# Patient Record
Sex: Male | Born: 1960
Health system: Southern US, Community
[De-identification: ages and names within clinical notes are randomized; demographics above are authoritative.]

## PROBLEM LIST (undated history)

## (undated) DIAGNOSIS — G3184 Mild cognitive impairment, so stated: Secondary | ICD-10-CM

## (undated) DIAGNOSIS — E785 Hyperlipidemia, unspecified: Secondary | ICD-10-CM

## (undated) DIAGNOSIS — M5416 Radiculopathy, lumbar region: Secondary | ICD-10-CM

## (undated) DIAGNOSIS — S069X9A Unspecified intracranial injury with loss of consciousness of unspecified duration, initial encounter: Secondary | ICD-10-CM

## (undated) DIAGNOSIS — K649 Unspecified hemorrhoids: Secondary | ICD-10-CM

## (undated) DIAGNOSIS — K2100 Gastro-esophageal reflux disease with esophagitis, without bleeding: Secondary | ICD-10-CM

## (undated) DIAGNOSIS — M1990 Unspecified osteoarthritis, unspecified site: Secondary | ICD-10-CM

## (undated) DIAGNOSIS — S069XAA Unspecified intracranial injury with loss of consciousness status unknown, initial encounter: Secondary | ICD-10-CM

## (undated) DIAGNOSIS — I10 Essential (primary) hypertension: Secondary | ICD-10-CM

## (undated) DIAGNOSIS — R7303 Prediabetes: Secondary | ICD-10-CM

## (undated) DIAGNOSIS — I2510 Atherosclerotic heart disease of native coronary artery without angina pectoris: Secondary | ICD-10-CM

## (undated) HISTORY — DX: Hyperlipidemia, unspecified: E78.5

## (undated) HISTORY — DX: Morbid (severe) obesity due to excess calories: E66.01

## (undated) HISTORY — DX: Mild cognitive impairment of uncertain or unknown etiology: G31.84

## (undated) HISTORY — DX: Unspecified intracranial injury with loss of consciousness status unknown, initial encounter: S06.9XAA

## (undated) HISTORY — DX: Unspecified intracranial injury with loss of consciousness of unspecified duration, initial encounter: S06.9X9A

## (undated) HISTORY — DX: Radiculopathy, lumbar region: M54.16

## (undated) HISTORY — PX: EYE SURGERY: SHX253

## (undated) HISTORY — DX: Unspecified osteoarthritis, unspecified site: M19.90

## (undated) HISTORY — PX: HERNIA REPAIR: SHX51

## (undated) HISTORY — DX: Prediabetes: R73.03

## (undated) HISTORY — PX: HEMORRHOID SURGERY: SHX153

## (undated) HISTORY — DX: Unspecified hemorrhoids: K64.9

---

## 1994-05-13 DIAGNOSIS — I2510 Atherosclerotic heart disease of native coronary artery without angina pectoris: Secondary | ICD-10-CM

## 1994-05-13 HISTORY — DX: Atherosclerotic heart disease of native coronary artery without angina pectoris: I25.10

## 2003-08-27 ENCOUNTER — Emergency Department (HOSPITAL_COMMUNITY): Admission: EM | Admit: 2003-08-27 | Discharge: 2003-08-28 | Payer: Self-pay | Admitting: Emergency Medicine

## 2004-10-29 ENCOUNTER — Ambulatory Visit: Payer: Self-pay

## 2004-11-01 ENCOUNTER — Ambulatory Visit: Payer: Self-pay | Admitting: Internal Medicine

## 2004-11-19 ENCOUNTER — Encounter: Admission: RE | Admit: 2004-11-19 | Discharge: 2004-11-19 | Payer: Self-pay | Admitting: Orthopedic Surgery

## 2004-12-05 ENCOUNTER — Ambulatory Visit: Payer: Self-pay | Admitting: Internal Medicine

## 2004-12-14 ENCOUNTER — Ambulatory Visit: Payer: Self-pay | Admitting: Internal Medicine

## 2005-01-04 ENCOUNTER — Ambulatory Visit: Payer: Self-pay | Admitting: Internal Medicine

## 2015-06-08 ENCOUNTER — Ambulatory Visit (INDEPENDENT_AMBULATORY_CARE_PROVIDER_SITE_OTHER): Payer: Self-pay | Admitting: Physician Assistant

## 2015-06-08 VITALS — BP 124/80 | HR 73 | Temp 97.7°F | Resp 20 | Ht 72.0 in | Wt 305.8 lb

## 2015-06-08 DIAGNOSIS — Z021 Encounter for pre-employment examination: Secondary | ICD-10-CM

## 2015-06-08 DIAGNOSIS — Z0289 Encounter for other administrative examinations: Secondary | ICD-10-CM

## 2015-06-08 NOTE — Progress Notes (Signed)
Steven Holden is a 55 y.o. male who presents today for a commercial driver fitness determination physical exam. The patient reports no problems. The following portions of the patient's history were reviewed and updated as appropriate: allergies, current medications, past family history, past medical history, past social history, past surgical history and problem list.  States he had a sleep test 3 years ago and negative. States his doctor ordered it because of his size. He denies a hx of DM, HTN, HLD, kidney or liver disease, lung disease. He does not use tobacco or drink alcohol. He does not currently have a PCP. He lives in [place]. Works part time  Review of Systems Pertinent items are noted in HPI.   Objective:    Vision/hearing: Hearing Screening Comments: The patient was able to hear a forced whisper from 10 feet. Vision Screening Comments: The patient can distinguish the colors red, amber and green.Peripheral Vision: Right eye 85 degrees. Left eye 85 degrees.  Applicant can recognize and distinguish among traffic control signals and devices showing standard red, green, and amber colors.  Corrective lenses required: Yes  Monocular Vision?: No  Hearing aid requirement: No  Physical Exam  Constitutional: He is oriented to person, place, and time. He appears well-developed and well-nourished.  HENT:  Head: Normocephalic and atraumatic.  Right Ear: Hearing, tympanic membrane, external ear and ear canal normal.  Left Ear: Hearing, tympanic membrane, external ear and ear canal normal.  Nose: Nose normal.  Mouth/Throat: Uvula is midline, oropharynx is clear and moist and mucous membranes are normal.  Eyes: Conjunctivae, EOM and lids are normal. Right eye exhibits no discharge. Left eye exhibits no discharge. No scleral icterus.  Neck: Trachea normal and normal range of motion. Neck supple. Carotid bruit is not present. No thyromegaly  present.  Cardiovascular: Normal rate, regular rhythm, normal heart sounds, intact distal pulses and normal pulses.   No murmur heard. Pulmonary/Chest: Effort normal and breath sounds normal. No respiratory distress. He has no wheezes. He has no rhonchi. He has no rales.  Abdominal: Soft. Normal appearance. There is no tenderness.  Musculoskeletal: Normal range of motion. He exhibits no edema or tenderness.  Lymphadenopathy:       Head (right side): No submental, no submandibular, no tonsillar, no preauricular, no posterior auricular and no occipital adenopathy present.       Head (left side): No submental, no submandibular, no tonsillar, no preauricular, no posterior auricular and no occipital adenopathy present.    He has no cervical adenopathy.  Neurological: He is alert and oriented to person, place, and time. He has normal strength and normal reflexes. No cranial nerve deficit or sensory deficit. Coordination and gait normal.  Skin: Skin is warm, dry and intact. No lesion and no rash noted.  Psychiatric: He has a normal mood and affect. His speech is normal and behavior is normal. Judgment and thought content normal.   BP 124/80 mmHg  Pulse 73  Temp(Src) 97.7 F (36.5 C) (Oral)  Resp 20  Ht 6' (1.829 m)  Wt 305 lb 12.8 oz (138.71 kg)  BMI 41.46 kg/m2  SpO2 98%  Labs: Comments: Sp. Gr. 1.015 Protein: Neg. Blood: Neg. Sugar: Neg.  Assessment:    Healthy male exam.  Meets standards in 12 CFR 391.41;  qualifies for 2 year certificate.    Plan:    Medical examiners certificate completed and printed. Return as needed.  Advised that pt establish care with PCP.

## 2016-04-25 ENCOUNTER — Emergency Department (HOSPITAL_BASED_OUTPATIENT_CLINIC_OR_DEPARTMENT_OTHER)
Admission: EM | Admit: 2016-04-25 | Discharge: 2016-04-25 | Disposition: A | Discharge status: Home / Self Care | Payer: Medicare Other | Attending: Emergency Medicine | Admitting: Emergency Medicine

## 2016-04-25 ENCOUNTER — Emergency Department (HOSPITAL_BASED_OUTPATIENT_CLINIC_OR_DEPARTMENT_OTHER): Payer: Medicare Other

## 2016-04-25 ENCOUNTER — Encounter (HOSPITAL_BASED_OUTPATIENT_CLINIC_OR_DEPARTMENT_OTHER): Payer: Self-pay | Admitting: *Deleted

## 2016-04-25 DIAGNOSIS — R0602 Shortness of breath: Secondary | ICD-10-CM | POA: Diagnosis not present

## 2016-04-25 DIAGNOSIS — I2510 Atherosclerotic heart disease of native coronary artery without angina pectoris: Secondary | ICD-10-CM | POA: Insufficient documentation

## 2016-04-25 DIAGNOSIS — I10 Essential (primary) hypertension: Secondary | ICD-10-CM | POA: Insufficient documentation

## 2016-04-25 DIAGNOSIS — R0789 Other chest pain: Secondary | ICD-10-CM | POA: Insufficient documentation

## 2016-04-25 DIAGNOSIS — R079 Chest pain, unspecified: Secondary | ICD-10-CM | POA: Diagnosis not present

## 2016-04-25 HISTORY — DX: Gastro-esophageal reflux disease with esophagitis: K21.0

## 2016-04-25 HISTORY — DX: Atherosclerotic heart disease of native coronary artery without angina pectoris: I25.10

## 2016-04-25 HISTORY — DX: Gastro-esophageal reflux disease with esophagitis, without bleeding: K21.00

## 2016-04-25 HISTORY — DX: Essential (primary) hypertension: I10

## 2016-04-25 LAB — CBC
HEMATOCRIT: 43.9 % (ref 39.0–52.0)
Hemoglobin: 14.5 g/dL (ref 13.0–17.0)
MCH: 28.3 pg (ref 26.0–34.0)
MCHC: 33 g/dL (ref 30.0–36.0)
MCV: 85.7 fL (ref 78.0–100.0)
PLATELETS: 260 10*3/uL (ref 150–400)
RBC: 5.12 MIL/uL (ref 4.22–5.81)
RDW: 14.8 % (ref 11.5–15.5)
WBC: 11.6 10*3/uL — ABNORMAL HIGH (ref 4.0–10.5)

## 2016-04-25 LAB — COMPREHENSIVE METABOLIC PANEL
ALT: 70 U/L — ABNORMAL HIGH (ref 17–63)
ANION GAP: 9 (ref 5–15)
AST: 41 U/L (ref 15–41)
Albumin: 4.4 g/dL (ref 3.5–5.0)
Alkaline Phosphatase: 84 U/L (ref 38–126)
BILIRUBIN TOTAL: 0.7 mg/dL (ref 0.3–1.2)
BUN: 11 mg/dL (ref 6–20)
CHLORIDE: 101 mmol/L (ref 101–111)
CO2: 25 mmol/L (ref 22–32)
Calcium: 9.3 mg/dL (ref 8.9–10.3)
Creatinine, Ser: 1.17 mg/dL (ref 0.61–1.24)
GFR calc Af Amer: >60 mL/min (ref >60)
Glucose, Bld: 100 mg/dL — ABNORMAL HIGH (ref 65–99)
POTASSIUM: 3.9 mmol/L (ref 3.5–5.1)
Sodium: 135 mmol/L (ref 135–145)
TOTAL PROTEIN: 8.5 g/dL — AB (ref 6.5–8.1)

## 2016-04-25 LAB — TROPONIN I

## 2016-04-25 LAB — BRAIN NATRIURETIC PEPTIDE: B NATRIURETIC PEPTIDE 5: 16.2 pg/mL (ref 0.0–100.0)

## 2016-04-25 MED ORDER — ASPIRIN 81 MG PO CHEW
324.0000 mg | CHEWABLE_TABLET | Freq: Once | ORAL | Status: AC
  Administered 2016-04-25: 324 mg via ORAL
  Filled 2016-04-25: qty 4

## 2016-04-25 MED ORDER — SODIUM CHLORIDE 0.9 % IV SOLN
INTRAVENOUS | Status: DC
  Administered 2016-04-25: 18:00:00 via INTRAVENOUS

## 2016-04-25 NOTE — ED Notes (Signed)
EDNP in to see and update pt/family.

## 2016-04-25 NOTE — ED Notes (Signed)
Snack given, family at [facility], denies sx or complaints, VSS.

## 2016-04-25 NOTE — ED Notes (Signed)
EDNP at [facility] speaking with pt/family.

## 2016-04-25 NOTE — ED Notes (Signed)
Hand off report provided to Jon-RN

## 2016-04-25 NOTE — ED Provider Notes (Signed)
[facility] Provider Note   CSN: [id number] Arrival date & time: 04/25/16  1714  By signing my name below, I, Emmanuella Mensah, attest that this documentation has been prepared under the direction and in the presence of Etta Quill, NP. Electronically Signed: Judithann Sauger, ED Scribe. 04/25/16. 5:50 PM.   History   Chief Complaint Chief Complaint  Patient presents with  . Chest Pain    HPI Comments: Steven Holden is a 55 y.o. male with a hx of hypertension and CAD who presents to the Emergency Department complaining of sudden onset, moderate non-radiating left lateral chest pain he describes as squeezing onset several minutes PTA. Pt was in the ED visiting another pt when he had onset of this pain. No alleviating factors noted. Pt has not tried any medications PTA. He has NKDA. He states that he had these symptoms when he had an MI in 1996 and stopped taking his HTN medications several years ago due to the cost. He denies any personal hx of DM but reports a family hx. He denies any daily ETOH, cigarette, or illicit drug use. He also denies any fever, chills, nausea, vomiting, diaphoresis, shortness of breath, or any other symptoms.   The history is provided by the patient. No language interpreter was used.  Chest Pain   This is a new problem. The current episode started less than 1 hour ago. The problem has not changed since onset.The pain is present in the lateral region. The pain is moderate. Quality: squeezing . The pain does not radiate. Pertinent negatives include no diaphoresis, no fever, no nausea, no shortness of breath and no vomiting. He has tried nothing for the symptoms. Risk factors include male gender.  His past medical history is significant for CAD and hypertension.  Pertinent negatives for past medical history include no diabetes.  His family medical history is significant for diabetes.    No past medical history on file.  There are no active  problems to display for this patient.   No past surgical history on file.     Home Medications    Prior to Admission medications   Not on File    Family History No family history on file.  Social History Social History  Substance Use Topics  . Smoking status: Never Smoker  . Smokeless tobacco: Not on file  . Alcohol use No     Allergies   Patient has no known allergies.   Review of Systems Review of Systems  Constitutional: Negative for chills, diaphoresis and fever.  Respiratory: Negative for shortness of breath.   Cardiovascular: Positive for chest pain.  Gastrointestinal: Negative for nausea and vomiting.  All other systems reviewed and are negative.    Physical Exam Updated Vital Signs There were no vitals taken for this visit.  Physical Exam  Constitutional: He is oriented to person, place, and time. He appears well-developed and well-nourished. No distress.  HENT:  Head: Normocephalic and atraumatic.  Eyes: Conjunctivae and EOM are normal.  Neck: Neck supple. No tracheal deviation present.  Cardiovascular: Normal rate, regular rhythm and normal heart sounds.   Pulmonary/Chest: Effort normal. No respiratory distress.  Musculoskeletal: Normal range of motion.  Neurological: He is alert and oriented to person, place, and time.  Skin: Skin is warm and dry.  Psychiatric: He has a normal mood and affect. His behavior is normal.  Nursing note and vitals reviewed.    ED Treatments / Results  DIAGNOSTIC STUDIES: Oxygen Saturation is 97% on  RA, adequate by my interpretation.    COORDINATION OF CARE: 5:29 PM- Pt advised of plan for treatment and pt agrees. Pt will receive lab work, chest x-ray, and EKG for further evaluation.    Labs (all labs ordered are listed, but only abnormal results are displayed) Labs Reviewed  CBC - Abnormal; Notable for the following:       Result Value   WBC 11.6 (*)    All other components within normal limits    COMPREHENSIVE METABOLIC PANEL - Abnormal; Notable for the following:    Glucose, Bld 100 (*)    Total Protein 8.5 (*)    ALT 70 (*)    All other components within normal limits  BRAIN NATRIURETIC PEPTIDE  TROPONIN I  TROPONIN I    EKG  EKG Interpretation None       Radiology Dg Chest Portable 1 View  Result Date: 04/25/2016 CLINICAL DATA:  Patient with chest pain shortness of breath. EXAM: PORTABLE CHEST 1 VIEW COMPARISON:  Chest radiograph 12/02/2013. FINDINGS: Stable cardiac and mediastinal contours. No consolidative pulmonary opacities. No pleural effusion or pneumothorax. IMPRESSION: No acute cardiopulmonary process. Electronically Signed   By: Lovey Newcomer M.D.   On: 04/25/2016 18:17    Procedures Procedures (including critical care time)  Medications Ordered in ED Medications - No data to display   Initial Impression / Assessment and Plan / ED Course  Etta Quill, NP has reviewed the triage vital signs and the nursing notes.  Pertinent labs & imaging results that were available during my care of the patient were reviewed by me and considered in my medical decision making (see chart for details).  Clinical Course   Patient is to be discharged with recommendation to follow up with PCP/cardiology in regards to today's hospital visit. Chest pain is not likely of cardiac or pulmonary etiology d/t presentation, perc negative, VSS, no tracheal deviation, no JVD or new murmur, RRR, breath sounds equal bilaterally, EKG without acute abnormalities, negative troponin, and negative CXR. Pt has been advised to return to the ED is CP becomes exertional, associated with diaphoresis or nausea, radiates to left jaw/arm, worsens or becomes concerning in any way. Pt appears reliable for follow up and is agreeable to discharge.   Case has been discussed with  Dr. Tyrone Nine who agrees with the above plan to discharge.     Final Clinical Impressions(s) / ED Diagnoses   Final diagnoses:   Other chest pain    New Prescriptions New Prescriptions   No medications on file   I personally performed the services described in this documentation, which was scribed in my presence. The recorded information has been reviewed and is accurate.    Etta Quill, NP 04/25/16 [place], DO 04/25/16 2224

## 2016-04-25 NOTE — ED Triage Notes (Signed)
Patient came to nurse first desk clutching his left chest stating that he felt like someone was squeezing his heart.  History of same.

## 2016-04-25 NOTE — ED Notes (Signed)
PCXR done

## 2016-06-27 DIAGNOSIS — L570 Actinic keratosis: Secondary | ICD-10-CM | POA: Diagnosis not present

## 2016-06-27 DIAGNOSIS — L281 Prurigo nodularis: Secondary | ICD-10-CM | POA: Diagnosis not present

## 2016-06-27 DIAGNOSIS — D485 Neoplasm of uncertain behavior of skin: Secondary | ICD-10-CM | POA: Diagnosis not present

## 2016-10-12 DIAGNOSIS — I252 Old myocardial infarction: Secondary | ICD-10-CM | POA: Diagnosis not present

## 2016-10-12 DIAGNOSIS — S61431A Puncture wound without foreign body of right hand, initial encounter: Secondary | ICD-10-CM | POA: Diagnosis not present

## 2017-02-19 DIAGNOSIS — Z1211 Encounter for screening for malignant neoplasm of colon: Secondary | ICD-10-CM | POA: Diagnosis not present

## 2017-03-05 DIAGNOSIS — H40033 Anatomical narrow angle, bilateral: Secondary | ICD-10-CM | POA: Diagnosis not present

## 2017-03-05 DIAGNOSIS — H40039 Anatomical narrow angle, unspecified eye: Secondary | ICD-10-CM | POA: Diagnosis not present

## 2017-03-05 DIAGNOSIS — H2513 Age-related nuclear cataract, bilateral: Secondary | ICD-10-CM | POA: Diagnosis not present

## 2017-03-17 DIAGNOSIS — Z1211 Encounter for screening for malignant neoplasm of colon: Secondary | ICD-10-CM | POA: Diagnosis not present

## 2017-03-17 DIAGNOSIS — E785 Hyperlipidemia, unspecified: Secondary | ICD-10-CM | POA: Diagnosis not present

## 2017-03-17 DIAGNOSIS — I252 Old myocardial infarction: Secondary | ICD-10-CM | POA: Diagnosis not present

## 2017-03-17 DIAGNOSIS — E669 Obesity, unspecified: Secondary | ICD-10-CM | POA: Diagnosis not present

## 2017-03-17 DIAGNOSIS — I2510 Atherosclerotic heart disease of native coronary artery without angina pectoris: Secondary | ICD-10-CM | POA: Diagnosis not present

## 2017-03-18 DIAGNOSIS — Z1331 Encounter for screening for depression: Secondary | ICD-10-CM | POA: Diagnosis not present

## 2017-03-18 DIAGNOSIS — Z2821 Immunization not carried out because of patient refusal: Secondary | ICD-10-CM | POA: Diagnosis not present

## 2017-03-18 DIAGNOSIS — I10 Essential (primary) hypertension: Secondary | ICD-10-CM | POA: Diagnosis not present

## 2017-03-18 DIAGNOSIS — Z13220 Encounter for screening for lipoid disorders: Secondary | ICD-10-CM | POA: Diagnosis not present

## 2017-03-18 DIAGNOSIS — Z87820 Personal history of traumatic brain injury: Secondary | ICD-10-CM | POA: Diagnosis not present

## 2017-03-18 DIAGNOSIS — Z6841 Body Mass Index (BMI) 40.0 and over, adult: Secondary | ICD-10-CM | POA: Diagnosis not present

## 2017-03-21 DIAGNOSIS — I10 Essential (primary) hypertension: Secondary | ICD-10-CM | POA: Diagnosis not present

## 2017-03-21 DIAGNOSIS — Z13220 Encounter for screening for lipoid disorders: Secondary | ICD-10-CM | POA: Diagnosis not present

## 2017-04-01 DIAGNOSIS — I10 Essential (primary) hypertension: Secondary | ICD-10-CM | POA: Diagnosis not present

## 2017-04-01 DIAGNOSIS — E7800 Pure hypercholesterolemia, unspecified: Secondary | ICD-10-CM | POA: Diagnosis not present

## 2017-04-01 DIAGNOSIS — Z6841 Body Mass Index (BMI) 40.0 and over, adult: Secondary | ICD-10-CM | POA: Diagnosis not present

## 2017-04-01 DIAGNOSIS — R7303 Prediabetes: Secondary | ICD-10-CM | POA: Diagnosis not present

## 2017-04-01 DIAGNOSIS — Z87820 Personal history of traumatic brain injury: Secondary | ICD-10-CM | POA: Diagnosis not present

## 2017-04-10 DIAGNOSIS — Z0000 Encounter for general adult medical examination without abnormal findings: Secondary | ICD-10-CM | POA: Diagnosis not present

## 2017-04-10 DIAGNOSIS — Z1331 Encounter for screening for depression: Secondary | ICD-10-CM | POA: Diagnosis not present

## 2017-04-10 DIAGNOSIS — Z136 Encounter for screening for cardiovascular disorders: Secondary | ICD-10-CM | POA: Diagnosis not present

## 2017-04-10 DIAGNOSIS — E669 Obesity, unspecified: Secondary | ICD-10-CM | POA: Diagnosis not present

## 2017-04-10 DIAGNOSIS — Z6841 Body Mass Index (BMI) 40.0 and over, adult: Secondary | ICD-10-CM | POA: Diagnosis not present

## 2017-04-10 DIAGNOSIS — Z125 Encounter for screening for malignant neoplasm of prostate: Secondary | ICD-10-CM | POA: Diagnosis not present

## 2017-04-10 DIAGNOSIS — E785 Hyperlipidemia, unspecified: Secondary | ICD-10-CM | POA: Diagnosis not present

## 2017-04-10 DIAGNOSIS — Z139 Encounter for screening, unspecified: Secondary | ICD-10-CM | POA: Diagnosis not present

## 2017-04-10 DIAGNOSIS — Z9181 History of falling: Secondary | ICD-10-CM | POA: Diagnosis not present

## 2017-07-15 DIAGNOSIS — M5416 Radiculopathy, lumbar region: Secondary | ICD-10-CM | POA: Diagnosis not present

## 2017-07-15 DIAGNOSIS — I10 Essential (primary) hypertension: Secondary | ICD-10-CM | POA: Diagnosis not present

## 2017-07-15 DIAGNOSIS — E7800 Pure hypercholesterolemia, unspecified: Secondary | ICD-10-CM | POA: Diagnosis not present

## 2017-08-15 DIAGNOSIS — I10 Essential (primary) hypertension: Secondary | ICD-10-CM | POA: Diagnosis not present

## 2017-08-15 DIAGNOSIS — M5416 Radiculopathy, lumbar region: Secondary | ICD-10-CM | POA: Diagnosis not present

## 2017-11-14 DIAGNOSIS — I10 Essential (primary) hypertension: Secondary | ICD-10-CM | POA: Diagnosis not present

## 2017-11-14 DIAGNOSIS — F329 Major depressive disorder, single episode, unspecified: Secondary | ICD-10-CM | POA: Diagnosis not present

## 2017-11-14 DIAGNOSIS — Z87820 Personal history of traumatic brain injury: Secondary | ICD-10-CM | POA: Diagnosis not present

## 2017-11-14 DIAGNOSIS — Z1339 Encounter for screening examination for other mental health and behavioral disorders: Secondary | ICD-10-CM | POA: Diagnosis not present

## 2017-12-18 DIAGNOSIS — F329 Major depressive disorder, single episode, unspecified: Secondary | ICD-10-CM | POA: Diagnosis not present

## 2017-12-18 DIAGNOSIS — Z6831 Body mass index (BMI) 31.0-31.9, adult: Secondary | ICD-10-CM | POA: Diagnosis not present

## 2017-12-18 DIAGNOSIS — I10 Essential (primary) hypertension: Secondary | ICD-10-CM | POA: Diagnosis not present

## 2018-02-13 ENCOUNTER — Encounter: Payer: Self-pay | Admitting: Cardiovascular Disease

## 2018-02-13 ENCOUNTER — Ambulatory Visit (INDEPENDENT_AMBULATORY_CARE_PROVIDER_SITE_OTHER): Payer: Medicare Other | Admitting: Cardiovascular Disease

## 2018-02-13 DIAGNOSIS — R0602 Shortness of breath: Secondary | ICD-10-CM | POA: Diagnosis not present

## 2018-02-13 DIAGNOSIS — R079 Chest pain, unspecified: Secondary | ICD-10-CM

## 2018-02-13 DIAGNOSIS — R9431 Abnormal electrocardiogram [ECG] [EKG]: Secondary | ICD-10-CM | POA: Diagnosis not present

## 2018-02-13 DIAGNOSIS — E7800 Pure hypercholesterolemia, unspecified: Secondary | ICD-10-CM | POA: Diagnosis not present

## 2018-02-13 DIAGNOSIS — M5416 Radiculopathy, lumbar region: Secondary | ICD-10-CM | POA: Diagnosis not present

## 2018-02-13 DIAGNOSIS — G894 Chronic pain syndrome: Secondary | ICD-10-CM

## 2018-02-13 DIAGNOSIS — R0609 Other forms of dyspnea: Secondary | ICD-10-CM | POA: Diagnosis not present

## 2018-02-13 DIAGNOSIS — I10 Essential (primary) hypertension: Secondary | ICD-10-CM | POA: Diagnosis not present

## 2018-02-13 NOTE — Patient Instructions (Addendum)
We will look into the price of the treadmill myoview If expensive,  We could consider the CT coronary calcium score [place], $150  Blood pressure cuff   Medication Instructions:   No medication changes made  Labwork:  Check a thyroid and testosterone  Testing/Procedures:  No further testing at this time   Follow-Up: It was a pleasure seeing you in the office today. Please call us if you have new issues that need to be addressed before your next appt.  [phone]  Your physician wants you to follow-up in:  As needed  If you need a refill on your cardiac medications before your next appointment, please call your pharmacy.  For educational health videos Log in to : [email or web address] Or : [email or web address], password : [email or web address]
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
## 2018-03-04 DIAGNOSIS — R5382 Chronic fatigue, unspecified: Secondary | ICD-10-CM | POA: Diagnosis not present

## 2018-04-07 DIAGNOSIS — E291 Testicular hypofunction: Secondary | ICD-10-CM | POA: Diagnosis not present

## 2018-04-07 DIAGNOSIS — Z125 Encounter for screening for malignant neoplasm of prostate: Secondary | ICD-10-CM | POA: Diagnosis not present

## 2018-04-07 DIAGNOSIS — Z6841 Body Mass Index (BMI) 40.0 and over, adult: Secondary | ICD-10-CM | POA: Diagnosis not present

## 2018-04-08 DIAGNOSIS — Z125 Encounter for screening for malignant neoplasm of prostate: Secondary | ICD-10-CM | POA: Diagnosis not present

## 2018-04-08 DIAGNOSIS — E291 Testicular hypofunction: Secondary | ICD-10-CM | POA: Diagnosis not present

## 2018-04-08 DIAGNOSIS — I10 Essential (primary) hypertension: Secondary | ICD-10-CM | POA: Diagnosis not present

## 2018-04-15 DIAGNOSIS — E291 Testicular hypofunction: Secondary | ICD-10-CM | POA: Diagnosis not present

## 2018-04-20 DIAGNOSIS — Z125 Encounter for screening for malignant neoplasm of prostate: Secondary | ICD-10-CM | POA: Diagnosis not present

## 2018-04-20 DIAGNOSIS — Z136 Encounter for screening for cardiovascular disorders: Secondary | ICD-10-CM | POA: Diagnosis not present

## 2018-04-20 DIAGNOSIS — Z1331 Encounter for screening for depression: Secondary | ICD-10-CM | POA: Diagnosis not present

## 2018-04-20 DIAGNOSIS — E785 Hyperlipidemia, unspecified: Secondary | ICD-10-CM | POA: Diagnosis not present

## 2018-04-20 DIAGNOSIS — Z6841 Body Mass Index (BMI) 40.0 and over, adult: Secondary | ICD-10-CM | POA: Diagnosis not present

## 2018-04-20 DIAGNOSIS — Z0000 Encounter for general adult medical examination without abnormal findings: Secondary | ICD-10-CM | POA: Diagnosis not present

## 2018-04-20 DIAGNOSIS — E669 Obesity, unspecified: Secondary | ICD-10-CM | POA: Diagnosis not present

## 2018-04-29 DIAGNOSIS — E291 Testicular hypofunction: Secondary | ICD-10-CM | POA: Diagnosis not present

## 2018-05-14 DIAGNOSIS — E291 Testicular hypofunction: Secondary | ICD-10-CM | POA: Diagnosis not present

## 2018-05-27 DIAGNOSIS — E875 Hyperkalemia: Secondary | ICD-10-CM | POA: Diagnosis not present

## 2018-05-27 DIAGNOSIS — E291 Testicular hypofunction: Secondary | ICD-10-CM | POA: Diagnosis not present

## 2018-06-29 DIAGNOSIS — J209 Acute bronchitis, unspecified: Secondary | ICD-10-CM | POA: Diagnosis not present

## 2018-06-29 DIAGNOSIS — Z6841 Body Mass Index (BMI) 40.0 and over, adult: Secondary | ICD-10-CM | POA: Diagnosis not present

## 2018-06-29 DIAGNOSIS — J101 Influenza due to other identified influenza virus with other respiratory manifestations: Secondary | ICD-10-CM | POA: Diagnosis not present

## 2019-02-08 DIAGNOSIS — M1990 Unspecified osteoarthritis, unspecified site: Secondary | ICD-10-CM | POA: Diagnosis not present

## 2019-02-08 DIAGNOSIS — I252 Old myocardial infarction: Secondary | ICD-10-CM | POA: Diagnosis not present

## 2019-02-08 DIAGNOSIS — S60562A Insect bite (nonvenomous) of left hand, initial encounter: Secondary | ICD-10-CM | POA: Diagnosis not present

## 2019-02-08 DIAGNOSIS — T63421A Toxic effect of venom of ants, accidental (unintentional), initial encounter: Secondary | ICD-10-CM | POA: Diagnosis not present

## 2019-02-08 DIAGNOSIS — E785 Hyperlipidemia, unspecified: Secondary | ICD-10-CM | POA: Diagnosis not present

## 2019-02-08 DIAGNOSIS — S60561A Insect bite (nonvenomous) of right hand, initial encounter: Secondary | ICD-10-CM | POA: Diagnosis not present

## 2019-02-08 DIAGNOSIS — I10 Essential (primary) hypertension: Secondary | ICD-10-CM | POA: Diagnosis not present

## 2019-03-07 DIAGNOSIS — R0789 Other chest pain: Secondary | ICD-10-CM | POA: Diagnosis not present

## 2019-03-07 DIAGNOSIS — R079 Chest pain, unspecified: Secondary | ICD-10-CM | POA: Diagnosis not present

## 2019-03-07 DIAGNOSIS — R0602 Shortness of breath: Secondary | ICD-10-CM | POA: Diagnosis not present

## 2019-03-07 DIAGNOSIS — I252 Old myocardial infarction: Secondary | ICD-10-CM | POA: Diagnosis not present

## 2019-03-07 DIAGNOSIS — E785 Hyperlipidemia, unspecified: Secondary | ICD-10-CM | POA: Diagnosis not present

## 2019-03-07 DIAGNOSIS — I10 Essential (primary) hypertension: Secondary | ICD-10-CM | POA: Diagnosis not present

## 2019-03-11 DIAGNOSIS — R0602 Shortness of breath: Secondary | ICD-10-CM | POA: Diagnosis not present

## 2019-03-11 DIAGNOSIS — R7303 Prediabetes: Secondary | ICD-10-CM | POA: Diagnosis not present

## 2019-03-11 DIAGNOSIS — I10 Essential (primary) hypertension: Secondary | ICD-10-CM | POA: Diagnosis not present

## 2019-03-11 DIAGNOSIS — E7800 Pure hypercholesterolemia, unspecified: Secondary | ICD-10-CM | POA: Diagnosis not present

## 2019-03-11 DIAGNOSIS — I208 Other forms of angina pectoris: Secondary | ICD-10-CM | POA: Diagnosis not present

## 2019-03-16 DIAGNOSIS — R0602 Shortness of breath: Secondary | ICD-10-CM | POA: Diagnosis not present

## 2019-03-17 ENCOUNTER — Other Ambulatory Visit: Payer: Self-pay

## 2019-03-17 ENCOUNTER — Ambulatory Visit (INDEPENDENT_AMBULATORY_CARE_PROVIDER_SITE_OTHER): Payer: 59 | Admitting: Cardiology

## 2019-03-17 ENCOUNTER — Encounter: Payer: Self-pay | Admitting: Cardiology

## 2019-03-17 VITALS — BP 142/86 | HR 62 | Ht 72.0 in | Wt 308.0 lb

## 2019-03-17 DIAGNOSIS — I209 Angina pectoris, unspecified: Secondary | ICD-10-CM

## 2019-03-17 DIAGNOSIS — I10 Essential (primary) hypertension: Secondary | ICD-10-CM

## 2019-03-17 DIAGNOSIS — E7800 Pure hypercholesterolemia, unspecified: Secondary | ICD-10-CM

## 2019-03-17 DIAGNOSIS — R072 Precordial pain: Secondary | ICD-10-CM

## 2019-03-17 MED ORDER — GABAPENTIN 300 MG PO CAPS: 300.0000 mg | ORAL_CAPSULE | Freq: Two times a day (BID) | ORAL | 3 refills | Status: AC

## 2019-03-17 MED ORDER — SIMVASTATIN 20 MG PO TABS: 20.0000 mg | ORAL_TABLET | Freq: Every day | ORAL | 3 refills | Status: AC

## 2019-03-17 MED ORDER — METOPROLOL SUCCINATE ER 25 MG PO TB24: 25.0000 mg | ORAL_TABLET | Freq: Every day | ORAL | 4 refills | Status: AC

## 2019-03-17 MED ORDER — ASPIRIN EC 81 MG PO TBEC: 81.0000 mg | DELAYED_RELEASE_TABLET | Freq: Every day | ORAL | 4 refills | Status: AC

## 2019-03-17 MED ORDER — NITROGLYCERIN 0.4 MG SL SUBL: 0.4000 mg | SUBLINGUAL_TABLET | SUBLINGUAL | 3 refills | Status: AC | PRN

## 2019-03-17 NOTE — Progress Notes (Signed)
Cardiology Office Note:    Date:  03/17/2019   ID:  Steven Push., DOB 05/06/1961, MRN [id number]  PCP:  Philmore Pali, NP  Cardiologist:  Jenean Lindau, MD   Referring MD: Philmore Pali, NP    ASSESSMENT:    1. Angina pectoris ([place])   2. Essential hypertension   3. Pure hypercholesterolemia   4. Morbid obesity ([place])   5. Precordial pain    PLAN:    In order of problems listed above:  1. Angina pectoris: The patient symptoms are very worrisome.  There are reproducible by stress.  He is a poor historian and has multiple risk factors for coronary artery disease.In view of the patient's symptoms, I discussed with the patient options for evaluation. Invasive and noninvasive options were given to the patient. I discussed stress testing and coronary angiography and left heart catheterization at length. Benefits, pros and cons of each approach were discussed at length. Patient had multiple questions which were answered to the patient's satisfaction. Patient opted for invasive evaluation and we will set up for coronary angiography and left heart catheterization. Further recommendations will be made based on the findings with coronary angiography. In the interim if the patient has any significant symptoms in hospital to the nearest emergency room. 2. Sublingual nitroglycerin prescription was sent, its protocol and 911 protocol explained and the patient vocalized understanding questions were answered to the patient's satisfaction 3. Essential hypertension: His blood pressure is elevated has not taken any medicines today.  We will switch lisinopril to Toprol-XL 25 mg daily. 4. He will continue taking coated 81 mg aspirin on a daily basis. 5. Mixed dyslipidemia: Diet was discussed for this and for obesity and risk stressed and he vocalized understanding.  He promises to do better. 6. Patient will be seen in follow-up appointment in 6 months or earlier if the patient has any concerns     Medication Adjustments/Labs and Tests Ordered: Current medicines are reviewed at length with the patient today.  Concerns regarding medicines are outlined above.  Orders Placed This Encounter  Procedures  . CT CORONARY MORPH W/CTA COR W/SCORE W/CA W/CM &/OR WO/CM  . CT CORONARY FRACTIONAL FLOW RESERVE DATA PREP  . CT CORONARY FRACTIONAL FLOW RESERVE FLUID ANALYSIS  . Basic Metabolic Panel (BMET)  . EKG 12-Lead   Meds ordered this encounter  Medications  . nitroGLYCERIN (NITROSTAT) 0.4 MG SL tablet    Sig: Place 1 tablet (0.4 mg total) under the tongue every 5 (five) minutes as needed.    Dispense:  25 tablet    Refill:  3  . metoprolol succinate (TOPROL XL) 25 MG 24 hr tablet    Sig: Take 1 tablet (25 mg total) by mouth daily.    Dispense:  30 tablet    Refill:  4     History of Present Illness:    Steven Cardella. is a 58 y.o. male who is being seen today for the evaluation of chest discomfort at the request of Lam, Rudi Rummage, NP.  Patient is a 58 year old male.  He has past medical history of essential hypertension, dyslipidemia, obesity, very sedentary lifestyle and mild cognitive impairment with memory loss.  Notes mention that he is a prediabetic and also has had traumatic brain injury.  His history is as follows.  The patient mentions to me that when he is under stress he has substernal chest tightness to the point that this took him to the emergency room.  No radiation to the neck or to the arms.  He does not exercise and leads a sedentary lifestyle.  He was very concerned about his symptoms and therefore he was referred here.  At the time of my evaluation, the patient is alert awake oriented and in no distress.  Past Medical History:  Diagnosis Date  . Arthritis   . Chronic lumbar radiculopathy   . Coronary artery disease 1996   MI   . Hemorrhoids   . Hyperlipidemia   . Hypertension   . Mild cognitive impairment with memory loss   . Morbid obesity ([place])   . Prediabetes    . Reflux esophagitis   . Traumatic brain injury [facility])     Past Surgical History:  Procedure Laterality Date  . EYE SURGERY     left eye  . HEMORRHOID SURGERY    . HERNIA REPAIR      Current Medications: No outpatient medications have been marked as taking for the 03/17/19 encounter (Office Visit) with Joseguadalupe Stan, Reita Cliche, MD.     Allergies:   Patient has no known allergies.   Social History   Socioeconomic History  . Marital status: Married    Spouse name: Not on file  . Number of children: Not on file  . Years of education: Not on file  . Highest education level: Not on file  Occupational History  . Not on file  Social Needs  . Financial resource strain: Not on file  . Food insecurity    Worry: Not on file    Inability: Not on file  . Transportation needs    Medical: Not on file    Non-medical: Not on file  Tobacco Use  . Smoking status: Never Smoker  . Smokeless tobacco: Never Used  Substance and Sexual Activity  . Alcohol use: Not Currently    Alcohol/week: 0.0 standard drinks  . Drug use: No  . Sexual activity: Not on file  Lifestyle  . Physical activity    Days per week: Not on file    Minutes per session: Not on file  . Stress: Not on file  Relationships  . Social Herbalist on phone: Not on file    Gets together: Not on file    Attends religious service: Not on file    Active member of club or organization: Not on file    Attends meetings of clubs or organizations: Not on file    Relationship status: Not on file  Other Topics Concern  . Not on file  Social History Narrative  . Not on file     Family History: The patient's family history includes Asthma in his mother; Cancer in his father; Diabetes in his father and paternal grandfather; Heart attack in his father; Heart disease in his father.  ROS:   Please see the history of present illness.    All other systems reviewed and are negative.  EKGs/Labs/Other Studies Reviewed:     The following studies were reviewed today: EKG reveals sinus rhythm and nonspecific ST-T changes   Recent Labs: No results found for requested labs within last 8760 hours.  Recent Lipid Panel No results found for: CHOL, TRIG, HDL, CHOLHDL, VLDL, LDLCALC, LDLDIRECT  Physical Exam:    VS:  BP (!) 142/86 (BP Location: Left Arm, Patient Position: Sitting, Cuff Size: Large)   Pulse 62   Ht 6' (1.829 m)   Wt (!) 308 lb (139.7 kg)   SpO2 97%   BMI  41.77 kg/m     Wt Readings from Last 3 Encounters:  03/17/19 (!) 308 lb (139.7 kg)  02/13/18 (!) 319 lb (144.7 kg)  06/08/15 (!) 305 lb 12.8 oz (138.7 kg)     GEN: Patient is in no acute distress HEENT: Normal NECK: No JVD; No carotid bruits LYMPHATICS: No lymphadenopathy CARDIAC: S1 S2 regular, 2/6 systolic murmur at the apex. RESPIRATORY:  Clear to auscultation without rales, wheezing or rhonchi  ABDOMEN: Soft, non-tender, non-distended MUSCULOSKELETAL:  No edema; No deformity  SKIN: Warm and dry NEUROLOGIC:  Alert and oriented x 3 PSYCHIATRIC:  Normal affect    Signed, Jenean Lindau, MD  03/17/2019 8:38 AM    [place]

## 2019-03-17 NOTE — Addendum Note (Signed)
Addended by: Beckey Rutter on: 03/17/2019 09:05 AM   Modules accepted: Orders

## 2019-03-17 NOTE — Patient Instructions (Signed)
Medication Instructions:  Your physician has recommended you make the following change in your medication:   STOP taking lisinopril  START taking Toprol XL 25 mg(1 tablet) once daily  START nitroglycerin as needed for chest pain. When having chest pain, stop what you are doing and sit down. Take 1 nitro, wait 5 minutes. Still having chest pain, take 1 nitro, wait 5 minutes. Still having chest pain, take 1 nitro, dial 911. Total of 3 nitro in 15 minutes.  If you need a refill on your cardiac medications before your next appointment, please call your pharmacy.   Lab work: You will need to come in 7 days prior to procedure to have a BMP drawn.  If you have labs (blood work) drawn today and your tests are completely normal, you will receive your results only by: Marland Kitchen MyChart Message (if you have MyChart) OR . A paper copy in the mail If you have any lab test that is abnormal or we need to change your treatment, we will call you to review the results.  Testing/Procedures: You had an EKG performed today  Non-Cardiac CT scanning, (CAT scanning), is a noninvasive, special x-ray that produces cross-sectional images of the body using x-rays and a computer. CT scans help physicians diagnose and treat medical conditions. For some CT exams, a contrast material is used to enhance visibility in the area of the body being studied. CT scans provide greater clarity and reveal more details than regular x-ray exams.  Please arrive at the [facility] main entrance of [facility] at xx:xx AM (30-45 minutes prior to test start time)  [facility] [place],  [id number] [phone]  Proceed to the [facility] Radiology Department (First Floor).  Please follow these instructions carefully (unless otherwise directed):  On the Night Before the Test: . Be sure to Drink plenty of water. . Do not consume any caffeinated/decaffeinated beverages or chocolate 12 hours prior  to your test. . Do not take any antihistamines 12 hours prior to your test.  On the Day of the Test: . Drink plenty of water. Do not drink any water within one hour of the test. . Do not eat any food 4 hours prior to the test. . You may take your regular medications prior to the test.  . Take metoprolol two hours prior to test on day of procedure      After the Test: . Drink plenty of water. . After receiving IV contrast, you may experience a mild flushed feeling. This is normal. . On occasion, you may experience a mild rash up to 24 hours after the test. This is not dangerous. If this occurs, you can take Benadryl 25 mg and increase your fluid intake. . If you experience trouble breathing, this can be serious. If it is severe call 911 IMMEDIATELY. If it is mild, please call our office.   Follow-Up: At [facility], you and your health needs are our priority.  As part of our continuing mission to provide you with exceptional heart care, we have created designated Provider Care Teams.  These Care Teams include your primary Cardiologist (physician) and Advanced Practice Providers (APPs -  Physician Assistants and Nurse Practitioners) who all work together to provide you with the care you need, when you need it. You will need a follow up appointment in 2 months.    Any Other Special Instructions Will Be Listed Below  Metoprolol extended-release tablets What is this medicine? METOPROLOL (  me TOE proe lole) is a beta-blocker. Beta-blockers reduce the workload on the heart and help it to beat more regularly. This medicine is used to treat high blood pressure and to prevent chest pain. It is also used to after a heart attack and to prevent an additional heart attack from occurring. This medicine may be used for other purposes; ask your health care provider or pharmacist if you have questions. COMMON BRAND NAME(S): toprol, Toprol XL What should I tell my health care provider before I take this  medicine? They need to know if you have any of these conditions:  diabetes  heart or vessel disease like slow heart rate, worsening heart failure, heart block, sick sinus syndrome or Raynaud's disease  kidney disease  liver disease  lung or breathing disease, like asthma or emphysema  pheochromocytoma  thyroid disease  an unusual or allergic reaction to metoprolol, other beta-blockers, medicines, foods, dyes, or preservatives  pregnant or trying to get pregnant  breast-feeding How should I use this medicine? Take this medicine by mouth with a glass of water. Follow the directions on the prescription label. Do not crush or chew. Take this medicine with or immediately after meals. Take your doses at regular intervals. Do not take more medicine than directed. Do not stop taking this medicine suddenly. This could lead to serious heart-related effects. Talk to your pediatrician regarding the use of this medicine in children. While this drug may be prescribed for children as young as 6 years for selected conditions, precautions do apply. Overdosage: If you think you have taken too much of this medicine contact a poison control center or emergency room at once. NOTE: This medicine is only for you. Do not share this medicine with others. What if I miss a dose? If you miss a dose, take it as soon as you can. If it is almost time for your next dose, take only that dose. Do not take double or extra doses. What may interact with this medicine? This medicine may interact with the following medications:  certain medicines for blood pressure, heart disease, irregular heart beat  certain medicines for depression, like monoamine oxidase (MAO) inhibitors, fluoxetine, or paroxetine  clonidine  dobutamine  epinephrine  isoproterenol  reserpine This list may not describe all possible interactions. Give your health care provider a list of all the medicines, herbs, non-prescription drugs, or  dietary supplements you use. Also tell them if you smoke, drink alcohol, or use illegal drugs. Some items may interact with your medicine. What should I watch for while using this medicine? Visit your doctor or health care professional for regular check ups. Contact your doctor right away if your symptoms worsen. Check your blood pressure and pulse rate regularly. Ask your health care professional what your blood pressure and pulse rate should be, and when you should contact them. You may get drowsy or dizzy. Do not drive, use machinery, or do anything that needs mental alertness until you know how this medicine affects you. Do not sit or stand up quickly, especially if you are an older patient. This reduces the risk of dizzy or fainting spells. Contact your doctor if these symptoms continue. Alcohol may interfere with the effect of this medicine. Avoid alcoholic drinks. This medicine may increase blood sugar. Ask your healthcare provider if changes in diet or medicines are needed if you have diabetes. What side effects may I notice from receiving this medicine? Side effects that you should report to your doctor or health  care professional as soon as possible:  allergic reactions like skin rash, itching or hives  cold or numb hands or feet  depression  difficulty breathing  faint  fever with sore throat  irregular heartbeat, chest pain  rapid weight gain   signs and symptoms of high blood sugar such as being more thirsty or hungry or having to urinate more than normal. You may also feel very tired or have blurry vision.  swollen legs or ankles Side effects that usually do not require medical attention (report to your doctor or health care professional if they continue or are bothersome):  anxiety or nervousness  change in sex drive or performance  dry skin  headache  nightmares or trouble sleeping  short term memory loss  stomach upset or diarrhea This list may not describe  all possible side effects. Call your doctor for medical advice about side effects. You may report side effects to FDA at 1-800-FDA-1088. Where should I keep my medicine? Keep out of the reach of children. Store at room temperature between 15 and 30 degrees C (59 and 86 degrees F). Throw away any unused medicine after the expiration date. NOTE: This sheet is a summary. It may not cover all possible information. If you have questions about this medicine, talk to your doctor, pharmacist, or health care provider.  2020 Elsevier/Gold Standard (2018-02-17 11:09:41)  Nitroglycerin sublingual tablets What is this medicine? NITROGLYCERIN (nye troe GLI ser in) is a type of vasodilator. It relaxes blood vessels, increasing the blood and oxygen supply to your heart. This medicine is used to relieve chest pain caused by angina. It is also used to prevent chest pain before activities like climbing stairs, going outdoors in cold weather, or sexual activity. This medicine may be used for other purposes; ask your health care provider or pharmacist if you have questions. COMMON BRAND NAME(S): Nitroquick, Nitrostat, Nitrotab What should I tell my health care provider before I take this medicine? They need to know if you have any of these conditions:  anemia  head injury, recent stroke, or bleeding in the brain  liver disease  previous heart attack  an unusual or allergic reaction to nitroglycerin, other medicines, foods, dyes, or preservatives  pregnant or trying to get pregnant  breast-feeding How should I use this medicine? Take this medicine by mouth as needed. At the first sign of an angina attack (chest pain or tightness) place one tablet under your tongue. You can also take this medicine 5 to 10 minutes before an event likely to produce chest pain. Follow the directions on the prescription label. Let the tablet dissolve under the tongue. Do not swallow whole. Replace the dose if you accidentally  swallow it. It will help if your mouth is not dry. Saliva around the tablet will help it to dissolve more quickly. Do not eat or drink, smoke or chew tobacco while a tablet is dissolving. If you are not better within 5 minutes after taking ONE dose of nitroglycerin, call 9-1-1 immediately to seek emergency medical care. Do not take more than 3 nitroglycerin tablets over 15 minutes. If you take this medicine often to relieve symptoms of angina, your doctor or health care professional may provide you with different instructions to manage your symptoms. If symptoms do not go away after following these instructions, it is important to call 9-1-1 immediately. Do not take more than 3 nitroglycerin tablets over 15 minutes. Talk to your pediatrician regarding the use of this medicine in children.  Special care may be needed. Overdosage: If you think you have taken too much of this medicine contact a poison control center or emergency room at once. NOTE: This medicine is only for you. Do not share this medicine with others. What if I miss a dose? This does not apply. This medicine is only used as needed. What may interact with this medicine? Do not take this medicine with any of the following medications:  certain migraine medicines like ergotamine and dihydroergotamine (DHE)  medicines used to treat erectile dysfunction like sildenafil, tadalafil, and vardenafil  riociguat This medicine may also interact with the following medications:  alteplase  aspirin  heparin  medicines for high blood pressure  medicines for mental depression  other medicines used to treat angina  phenothiazines like chlorpromazine, mesoridazine, prochlorperazine, thioridazine This list may not describe all possible interactions. Give your health care provider a list of all the medicines, herbs, non-prescription drugs, or dietary supplements you use. Also tell them if you smoke, drink alcohol, or use illegal drugs. Some  items may interact with your medicine. What should I watch for while using this medicine? Tell your doctor or health care professional if you feel your medicine is no longer working. Keep this medicine with you at all times. Sit or lie down when you take your medicine to prevent falling if you feel dizzy or faint after using it. Try to remain calm. This will help you to feel better faster. If you feel dizzy, take several deep breaths and lie down with your feet propped up, or bend forward with your head resting between your knees. You may get drowsy or dizzy. Do not drive, use machinery, or do anything that needs mental alertness until you know how this drug affects you. Do not stand or sit up quickly, especially if you are an older patient. This reduces the risk of dizzy or fainting spells. Alcohol can make you more drowsy and dizzy. Avoid alcoholic drinks. Do not treat yourself for coughs, colds, or pain while you are taking this medicine without asking your doctor or health care professional for advice. Some ingredients may increase your blood pressure. What side effects may I notice from receiving this medicine? Side effects that you should report to your doctor or health care professional as soon as possible:  blurred vision  dry mouth  skin rash  sweating  the feeling of extreme pressure in the head  unusually weak or tired Side effects that usually do not require medical attention (report to your doctor or health care professional if they continue or are bothersome):  flushing of the face or neck  headache  irregular heartbeat, palpitations  nausea, vomiting This list may not describe all possible side effects. Call your doctor for medical advice about side effects. You may report side effects to FDA at 1-800-FDA-1088. Where should I keep my medicine? Keep out of the reach of children. Store at room temperature between 20 and 25 degrees C (68 and 77 degrees F). Store in  [facility] of [employer]. Protect from light and moisture. Keep tightly closed. Throw away any unused medicine after the expiration date. NOTE: This sheet is a summary. It may not cover all possible information. If you have questions about this medicine, talk to your doctor, pharmacist, or health care provider.  2020 Elsevier/Gold Standard (2013-02-25 17:57:36)  Coronary Angiogram A coronary angiogram is an X-ray procedure that is used to examine the arteries in the heart. In this procedure, a dye (contrast dye) is  injected through a long, thin tube (catheter). The catheter is inserted through the groin, wrist, or arm. The dye is injected into each artery, then X-rays are taken to show if there is a blockage in the arteries of the heart. This procedure can also show if you have valve disease or a disease of the aorta, and it can be used to check the overall function of your heart muscle. You may have a coronary angiogram if:  You are having chest pain, or other symptoms of angina, and you are at risk for heart disease.  You have an abnormal electrocardiogram (ECG) or stress test.  You have chest pain and heart failure.  You are having irregular heart rhythms.  You and your health care provider determine that the benefits of the test information outweigh the risks of the procedure. Let your health care provider know about:  Any allergies you have, including allergies to contrast dye.  All medicines you are taking, including vitamins, herbs, eye drops, creams, and over-the-counter medicines.  Any problems you or family members have had with anesthetic medicines.  Any blood disorders you have.  Any surgeries you have had.  History of kidney problems or kidney failure.  Any medical conditions you have.  Whether you are pregnant or may be pregnant. What are the risks? Generally, this is a safe procedure. However, problems may occur, including:  Infection.  Allergic reaction to medicines  or dyes that are used.  Bleeding from the access site or other locations.  Kidney injury, especially in people with impaired kidney function.  Stroke (rare).  Heart attack (rare).  Damage to other structures or organs. What happens before the procedure? Staying hydrated Follow instructions from your health care provider about hydration, which may include:  Up to 2 hours before the procedure - you may continue to drink clear liquids, such as water, clear fruit juice, black coffee, and plain tea. Eating and drinking restrictions Follow instructions from your health care provider about eating and drinking, which may include:  8 hours before the procedure - stop eating heavy meals or foods such as meat, fried foods, or fatty foods.  6 hours before the procedure - stop eating light meals or foods, such as toast or cereal.  2 hours before the procedure - stop drinking clear liquids. General instructions  Ask your health care provider about: ? Changing or stopping your regular medicines. This is especially important if you are taking diabetes medicines or blood thinners. ? Taking medicines such as ibuprofen. These medicines can thin your blood. Do not take these medicines before your procedure if your health care provider instructs you not to, though aspirin may be recommended prior to coronary angiograms.  Plan to have someone take you home from the hospital or clinic.  You may need to have blood tests or X-rays done. What happens during the procedure?  An IV tube will be inserted into one of your veins.  You will be given one or more of the following: ? A medicine to help you relax (sedative). ? A medicine to numb the area where the catheter will be inserted into an artery (local anesthetic).  To reduce your risk of infection: ? Your health care team will wash or sanitize their hands. ? Your skin will be washed with soap. ? Hair may be removed from the area where the catheter  will be inserted.  You will be connected to a continuous ECG monitor.  The catheter will be inserted into  an artery. The location may be in your groin, in your wrist, or in the fold of your arm (near your elbow).  A type of X-ray (fluoroscopy) will be used to help guide the catheter to the opening of the blood vessel that is being examined.  A dye will be injected into the catheter, and X-rays will be taken. The dye will help to show where any narrowing or blockages are located in the heart arteries.  Tell your health care provider if you have any chest pain or trouble breathing during the procedure.  If blockages are found, your health care provider may perform another procedure, such as inserting a coronary stent. The procedure may vary among health care providers and hospitals. What happens after the procedure?  After the procedure, you will need to keep the area still for a few hours, or for as long as told by your health care provider. If the procedure is done through the groin, you will be instructed to not bend and not cross your legs.  The insertion site will be checked frequently.  The pulse in your foot or wrist will be checked frequently.  You may have additional blood tests, X-rays, and a test that records the electrical activity of your heart (ECG).  Do not drive for 24 hours if you were given a sedative. Summary  A coronary angiogram is an X-ray procedure that is used to look into the arteries in the heart.  During the procedure, a dye (contrast dye) is injected through a long, thin tube (catheter). The catheter is inserted through the groin, wrist, or arm.  Tell your health care provider about any allergies you have, including allergies to contrast dye.  After the procedure, you will need to keep the area still for a few hours, or for as long as told by your health care provider. This information is not intended to replace advice given to you by your health care  provider. Make sure you discuss any questions you have with your health care provider. Document Released: 11/03/2002 Document Revised: 02/09/2016 Document Reviewed: 02/09/2016 Elsevier Interactive Patient Education  2019 [employer].

## 2019-03-24 ENCOUNTER — Telehealth: Payer: Self-pay | Admitting: Cardiology

## 2019-03-24 NOTE — Telephone Encounter (Signed)
Patient wants test done before end of the year or he will not have done due to his insurance,,

## 2019-05-04 ENCOUNTER — Encounter (HOSPITAL_COMMUNITY): Payer: Self-pay

## 2019-05-05 ENCOUNTER — Telehealth (HOSPITAL_COMMUNITY): Payer: Self-pay | Admitting: Emergency Medicine

## 2019-05-05 NOTE — Telephone Encounter (Signed)
Left message on voicemail with name and callback number Mari Battaglia RN Navigator [facility] [phone] Office [phone] Cell  

## 2019-05-06 ENCOUNTER — Other Ambulatory Visit: Payer: Self-pay

## 2019-05-06 ENCOUNTER — Ambulatory Visit (HOSPITAL_COMMUNITY)
Admission: RE | Admit: 2019-05-06 | Discharge: 2019-05-06 | Disposition: A | Discharge status: Home / Self Care | Payer: 59 | Source: Ambulatory Visit | Attending: Cardiology | Admitting: Cardiology

## 2019-05-06 DIAGNOSIS — R072 Precordial pain: Secondary | ICD-10-CM | POA: Diagnosis not present

## 2019-05-06 DIAGNOSIS — I209 Angina pectoris, unspecified: Secondary | ICD-10-CM | POA: Diagnosis not present

## 2019-05-06 MED ORDER — NITROGLYCERIN 0.4 MG SL SUBL
SUBLINGUAL_TABLET | SUBLINGUAL | Status: AC
  Filled 2019-05-06: qty 2

## 2019-05-06 MED ORDER — IOHEXOL 350 MG/ML SOLN
80.0000 mL | Freq: Once | INTRAVENOUS | Status: AC | PRN
  Administered 2019-05-06: 80 mL via INTRAVENOUS

## 2019-05-06 MED ORDER — NITROGLYCERIN 0.4 MG SL SUBL
0.8000 mg | SUBLINGUAL_TABLET | Freq: Once | SUBLINGUAL | Status: AC
  Administered 2019-05-06: 0.8 mg via SUBLINGUAL

## 2019-05-31 ENCOUNTER — Telehealth: Payer: Self-pay

## 2019-05-31 DIAGNOSIS — I10 Essential (primary) hypertension: Secondary | ICD-10-CM

## 2019-05-31 DIAGNOSIS — I2584 Coronary atherosclerosis due to calcified coronary lesion: Secondary | ICD-10-CM

## 2019-05-31 DIAGNOSIS — I2510 Atherosclerotic heart disease of native coronary artery without angina pectoris: Secondary | ICD-10-CM

## 2019-05-31 NOTE — Telephone Encounter (Signed)
Left message for patient to call back for results. Copy sent to Dr. Chauncy Passy.

## 2019-05-31 NOTE — Telephone Encounter (Signed)
-----   Message from Jenean Lindau, MD sent at 05/16/2019  2:31 PM EST ----- Has coronary calcification. Significant. Needs bmp and ll in next few days. Cc pcp Jenean Lindau, MD 05/16/2019 2:31 PM

## 2019-06-02 ENCOUNTER — Telehealth: Payer: Self-pay | Admitting: Cardiology

## 2019-06-02 NOTE — Telephone Encounter (Signed)
New Message  Steven Holden called again because he said he didn't quite understand the results that was given. would like to speak with someone again regarding results.  Please call to discuss

## 2019-06-02 NOTE — Telephone Encounter (Signed)
Patients wife (DPR) wanted results relayed to her also. No further questions

## 2019-06-02 NOTE — Telephone Encounter (Signed)
Left second message for patient to call office for results.

## 2019-06-02 NOTE — Telephone Encounter (Signed)
Results relayed, patient will come into office tomorrow for repeat labs.

## 2019-09-28 DIAGNOSIS — C8339 Diffuse large B-cell lymphoma, extranodal and solid organ sites: Secondary | ICD-10-CM | POA: Diagnosis not present

## 2019-10-15 DIAGNOSIS — C8516 Unspecified B-cell lymphoma, intrapelvic lymph nodes: Secondary | ICD-10-CM | POA: Diagnosis not present

## 2020-01-24 DIAGNOSIS — I82421 Acute embolism and thrombosis of right iliac vein: Secondary | ICD-10-CM | POA: Diagnosis not present

## 2020-01-24 DIAGNOSIS — F329 Major depressive disorder, single episode, unspecified: Secondary | ICD-10-CM | POA: Diagnosis not present

## 2020-01-24 DIAGNOSIS — D649 Anemia, unspecified: Secondary | ICD-10-CM | POA: Diagnosis not present

## 2020-01-24 DIAGNOSIS — I82431 Acute embolism and thrombosis of right popliteal vein: Secondary | ICD-10-CM | POA: Diagnosis not present

## 2020-01-24 DIAGNOSIS — C8336 Diffuse large B-cell lymphoma, intrapelvic lymph nodes: Secondary | ICD-10-CM | POA: Diagnosis not present

## 2020-01-24 DIAGNOSIS — Z7901 Long term (current) use of anticoagulants: Secondary | ICD-10-CM | POA: Diagnosis not present

## 2020-02-14 DIAGNOSIS — I82431 Acute embolism and thrombosis of right popliteal vein: Secondary | ICD-10-CM | POA: Diagnosis not present

## 2020-02-14 DIAGNOSIS — Z7901 Long term (current) use of anticoagulants: Secondary | ICD-10-CM | POA: Diagnosis not present

## 2020-02-14 DIAGNOSIS — R5383 Other fatigue: Secondary | ICD-10-CM | POA: Diagnosis not present

## 2020-02-14 DIAGNOSIS — F329 Major depressive disorder, single episode, unspecified: Secondary | ICD-10-CM | POA: Diagnosis not present

## 2020-02-14 DIAGNOSIS — C8336 Diffuse large B-cell lymphoma, intrapelvic lymph nodes: Secondary | ICD-10-CM | POA: Diagnosis not present

## 2020-02-14 DIAGNOSIS — I82421 Acute embolism and thrombosis of right iliac vein: Secondary | ICD-10-CM | POA: Diagnosis not present

## 2020-02-25 DIAGNOSIS — C8336 Diffuse large B-cell lymphoma, intrapelvic lymph nodes: Secondary | ICD-10-CM | POA: Diagnosis not present

## 2020-04-11 DIAGNOSIS — Z452 Encounter for adjustment and management of vascular access device: Secondary | ICD-10-CM | POA: Diagnosis not present

## 2020-04-11 DIAGNOSIS — C8330 Diffuse large B-cell lymphoma, unspecified site: Secondary | ICD-10-CM | POA: Diagnosis not present

## 2020-06-29 DIAGNOSIS — C8330 Diffuse large B-cell lymphoma, unspecified site: Secondary | ICD-10-CM | POA: Diagnosis not present

## 2020-07-23 IMAGING — CT CT HEART MORP W/ CTA COR W/ SCORE W/ CA W/CM &/OR W/O CM
4 of 7 series · 8 of 20 positions shown, 9 images · IV contrast (APPLIED)
Comparison: None.

Addendum:
EXAM:
OVER-READ INTERPRETATION  CT CHEST

The following report is an over-read performed by radiologist Dr.
Nedzad Anesa Mjesecevic [REDACTED] on 05/06/2019. This
over-read does not include interpretation of cardiac or coronary
anatomy or pathology. The
Cardiac/Coronary  CT
TECHNIQUE: The patient was scanned on a [employer] scanner.

[Series 8: best diast 71 % · axial · 0.39mm/px · z∈[+1282,+1325]mm · 2 of 321 slices shown]
[im 107/321  vessel]
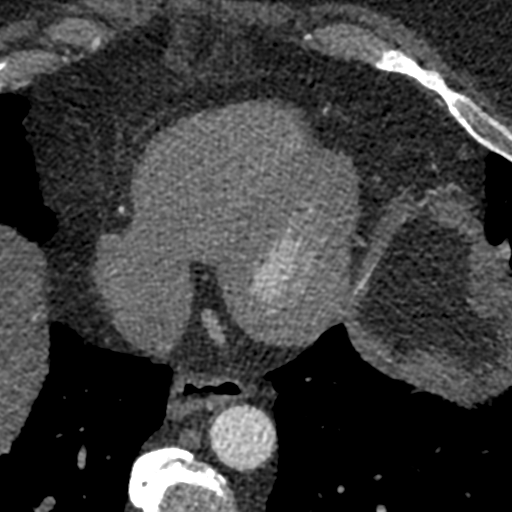
[im 214/321  vessel]
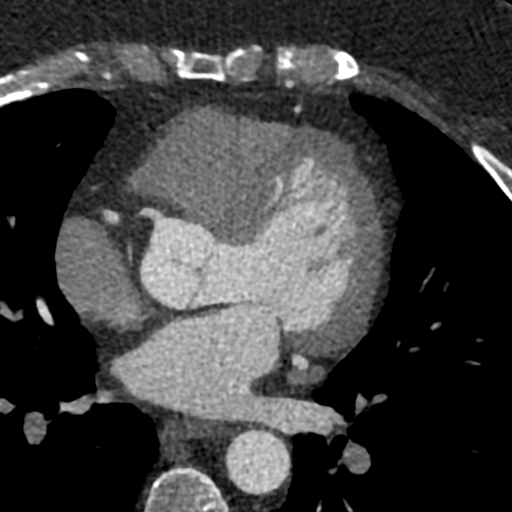

[Series 9: best syst · axial · 0.39mm/px · z∈[+1282,+1325]mm · 2 of 321 slices shown, 3 images]
[im 107/321  vessel]
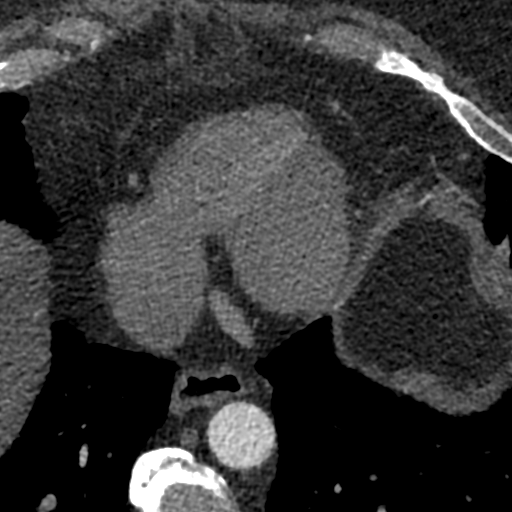
[im 107/321  lung]
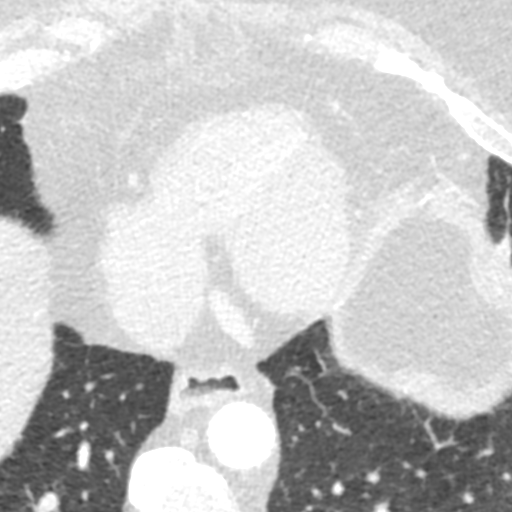
[im 214/321  vessel]
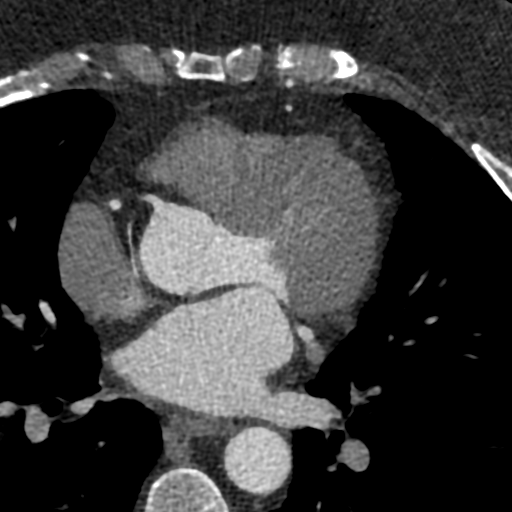

[Series 10: ts diast sharp 33 % · axial · 0.39mm/px · z∈[+1282,+1325]mm · 2 of 321 slices shown]
[im 107/321  lung]
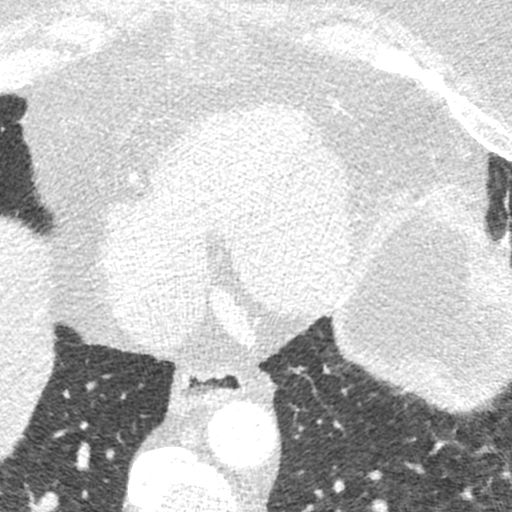
[im 214/321  lung]
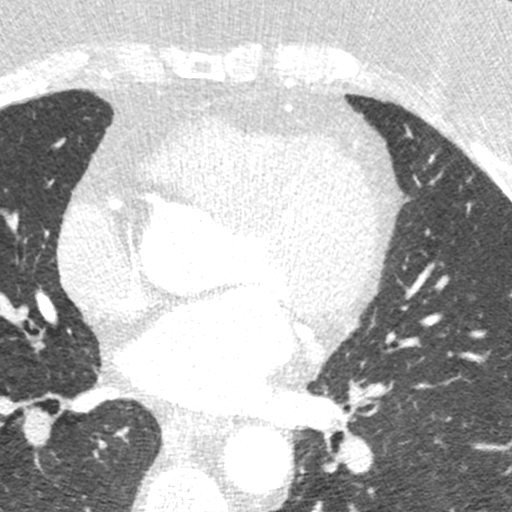

[Series 11: ts syst sharp · axial · 0.39mm/px · z∈[+1282,+1325]mm · 2 of 321 slices shown]
[im 107/321  lung]
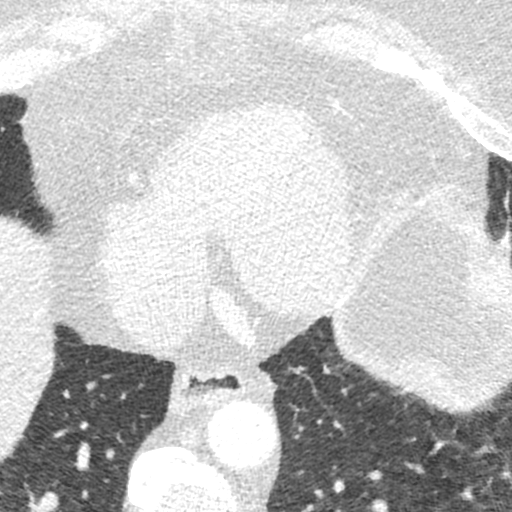
[im 214/321  lung]
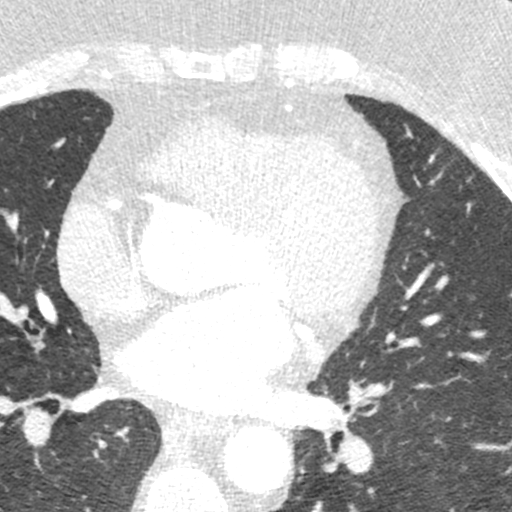

[8 of 20 positions shown; findings below may reference images not displayed]



Aorta:  Normal size.  No calcifications.  No dissection.

Aortic Valve:  Trileaflet.  No calcifications.

Coronary Arteries:  Normal coronary origin.  Right dominance.

RCA is a moderate sized dominant artery that gives rise to a small
PDA and PLVB. There is no plaque.

Left main is a large artery that gives rise to LAD and LCX arteries.
There is no plaque.

LAD is a large vessel that gives rise to a moderate sized diagonal.
There is minimal calcified plaque in the proximal and mid LAD with
associated stenosis of 0-24%.

LCX is a non-dominant artery that gives rise to one large OM1
branch. There is no plaque.

Other findings:

Normal pulmonary vein drainage into the left atrium.

Normal let atrial appendage without a thrombus.

Normal size of the pulmonary artery.
IMPRESSION: 1. Coronary calcium score of 12. This was 51st percentile for age
and sex matched control.

2.  Normal coronary origin with right dominance.

3.  Minimal atherosclerosis of the LAD.  CAD-RADS 1.

4.  Consider other non cardiac etiologies for chest pain.

5.  Recommend aggressive risk factor modification.

Nedzad Anesa Mjesecevic

EXAM:
OVER-READ INTERPRETATION  CT CHEST

The following report is an over-read performed by radiologist Dr.
Auntyjatty Delowr [REDACTED] on 05/06/2019. This over-read
does not include interpretation of cardiac or coronary anatomy or
pathology. The coronary CTA interpretation by the cardiologist is
attached.
FINDINGS: Heart is normal size. Visualized aorta normal caliber. No adenopathy
in the lower mediastinum or hila. Visualized lungs clear. No
effusions.

Imaging into the upper abdomen shows no acute findings. Chest wall
soft tissues are unremarkable. No acute bony abnormality.
IMPRESSION: No acute or significant extracardiac abnormality.

*** End of Addendum ***
EXAM:
OVER-READ INTERPRETATION  CT CHEST

The following report is an over-read performed by radiologist Dr.
Nedzad Anesa Mjesecevic [REDACTED] on 05/06/2019. This
over-read does not include interpretation of cardiac or coronary
anatomy or pathology. The

Cardiac/Coronary  CT
FINDINGS: A 120 kV prospective scan was triggered in the descending thoracic
aorta at 111 HU's. Axial non-contrast 3 mm slices were carried out
through the heart. The data set was analyzed on a dedicated work
station and scored using the Agatson method. Gantry rotation speed
was 250 msecs and collimation was .6 mm. No beta blockade and 0.8 mg
of sl NTG was given. The 3D data set was reconstructed in 5%
intervals of the 67-82 % of the R-R cycle. Diastolic phases were
analyzed on a dedicated work station using MPR, MIP and VRT modes.
The patient received 80 cc of contrast.

Aorta:  Normal size.  No calcifications.  No dissection.

Aortic Valve:  Trileaflet.  No calcifications.

Coronary Arteries:  Normal coronary origin.  Right dominance.

RCA is a moderate sized dominant artery that gives rise to a small
PDA and PLVB. There is no plaque.

Left main is a large artery that gives rise to LAD and LCX arteries.
There is no plaque.

LAD is a large vessel that gives rise to a moderate sized diagonal.
There is minimal calcified plaque in the proximal and mid LAD with
associated stenosis of 0-24%.

LCX is a non-dominant artery that gives rise to one large OM1
branch. There is no plaque.

Other findings:

Normal pulmonary vein drainage into the left atrium.

Normal let atrial appendage without a thrombus.

Normal size of the pulmonary artery.
IMPRESSION: 1. Coronary calcium score of 12. This was 51st percentile for age
and sex matched control.

2.  Normal coronary origin with right dominance.

3.  Minimal atherosclerosis of the LAD.  CAD-RADS 1.

4.  Consider other non cardiac etiologies for chest pain.

5.  Recommend aggressive risk factor modification.

Nedzad Anesa Mjesecevic

## 2024-06-23 ENCOUNTER — Ambulatory Visit: Admitting: Family
